# Patient Record
Sex: Male | Born: 2000 | Race: Black or African American | Hispanic: No | Marital: Single | Smoking: Never smoker
Health system: Southern US, Community
[De-identification: ages and names within clinical notes are randomized; demographics above are authoritative.]

---
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
## 2018-09-05 ENCOUNTER — Other Ambulatory Visit: Payer: Self-pay

## 2018-09-05 ENCOUNTER — Emergency Department (HOSPITAL_BASED_OUTPATIENT_CLINIC_OR_DEPARTMENT_OTHER)
Admission: EM | Admit: 2018-09-05 | Discharge: 2018-09-05 | Disposition: A | Discharge status: Home / Self Care | Payer: Medicaid Other | Attending: Emergency Medicine | Admitting: Emergency Medicine

## 2018-09-05 ENCOUNTER — Emergency Department (HOSPITAL_BASED_OUTPATIENT_CLINIC_OR_DEPARTMENT_OTHER): Payer: Medicaid Other

## 2018-09-05 ENCOUNTER — Encounter (HOSPITAL_BASED_OUTPATIENT_CLINIC_OR_DEPARTMENT_OTHER): Payer: Self-pay | Admitting: *Deleted

## 2018-09-05 DIAGNOSIS — F17210 Nicotine dependence, cigarettes, uncomplicated: Secondary | ICD-10-CM | POA: Insufficient documentation

## 2018-09-05 DIAGNOSIS — R0789 Other chest pain: Secondary | ICD-10-CM | POA: Insufficient documentation

## 2018-09-05 DIAGNOSIS — Z79899 Other long term (current) drug therapy: Secondary | ICD-10-CM | POA: Insufficient documentation

## 2018-09-05 NOTE — ED Triage Notes (Signed)
Pt reports recurrent chest pain x 2-3 weeks

## 2018-09-07 NOTE — ED Provider Notes (Signed)
[facility] EMERGENCY DEPARTMENT Provider Note   CSN: [id number] Arrival date & time: 09/05/18  1736     History   Chief Complaint Chief Complaint  Patient presents with  . Chest Pain    HPI Wesley Arias is a 17 y.o. male.  17yo M who p/w chest pain.  Patient states that he has had intermittent episodes of sharp, central, nonradiating chest pain over the past 2 to 3 weeks.  Pain is not related to exertion.  Nothing seems to bring it on.  Nothing makes it better.  He occasionally has some shortness of breath with it.  No vomiting, fevers, or significant cough/URI symptoms.  No major change in physical activity.  No recent travel, history of blood clots, or leg swelling. Denies drug use.  The history is provided by the patient.  Chest Pain      History reviewed. No pertinent past medical history.  There are no active problems to display for this patient.   No past surgical history on file.      Home Medications    Prior to Admission medications   Medication Sig Start Date End Date Taking? Authorizing Provider  omeprazole (PRILOSEC) 20 MG capsule Take 1 capsule (20 mg total) by mouth daily. 12/17/17   Molpus, Jonny RuizJohn, MD  ondansetron (ZOFRAN ODT) 8 MG disintegrating tablet Take 1 tablet (8 mg total) by mouth every 8 (eight) hours as needed for nausea or vomiting. 12/17/17   Molpus, Jonny RuizJohn, MD    Family History No family history on file.  Social History Social History   Tobacco Use  . Smoking status: Current Every Day Smoker    Types: Cigarettes  . Smokeless tobacco: Never Used  Substance Use Topics  . Alcohol use: No    Frequency: Never  . Drug use: Yes     Allergies   Patient has no known allergies.   Review of Systems Review of Systems  Cardiovascular: Positive for chest pain.   All other systems reviewed and are negative except that which was mentioned in HPI   Physical Exam Updated Vital Signs BP 110/75   Pulse 75   Temp 98.3 F (36.8  C)   Resp 18   Ht 6\' 4"  (1.93 m)   Wt 114.8 kg   SpO2 100%   BMI 30.80 kg/m   Physical Exam  Constitutional: He is oriented to person, place, and time. He appears well-developed and well-nourished. No distress.  HENT:  Head: Normocephalic and atraumatic.  Moist mucous membranes  Eyes: Conjunctivae are normal.  Neck: Neck supple.  Cardiovascular: Normal rate, regular rhythm and normal heart sounds. Exam reveals no friction rub.  No murmur heard.  No systolic murmur is present. Pulmonary/Chest: Effort normal and breath sounds normal.  Abdominal: Soft. Bowel sounds are normal. He exhibits no distension. There is no tenderness.  Musculoskeletal: He exhibits no edema.  Neurological: He is alert and oriented to person, place, and time.  Fluent speech  Skin: Skin is warm and dry.  Psychiatric: He has a normal mood and affect. Judgment normal.  Nursing note and vitals reviewed.    ED Treatments / Results  Labs (all labs ordered are listed, but only abnormal results are displayed) Labs Reviewed - No data to display  EKG EKG Interpretation  Date/Time:  Wednesday September 05 2018 17:42:29 EST Ventricular Rate:  76 PR Interval:  176 QRS Duration: 84 QT Interval:  334 QTC Calculation: 375 R Axis:   80 Text Interpretation:  Normal  sinus rhythm Normal ECG No previous ECGs available Confirmed by Frederick Peers [phone]) on 09/05/2018 5:52:24 PM Also confirmed by Frederick Peers [phone]), editor Elita Quick [phone])  on 09/06/2018 10:13:03 AM   Radiology Dg Chest 2 View  Result Date: 09/05/2018 CLINICAL DATA:  Intermittent chest pain EXAM: CHEST - 2 VIEW COMPARISON:  None. FINDINGS: The heart size and mediastinal contours are within normal limits. Both lungs are clear. The visualized skeletal structures are unremarkable. IMPRESSION: No active cardiopulmonary disease. Electronically Signed   By: Jasmine Pang M.D.   On: 09/05/2018 18:33    Procedures Procedures (including  critical care time)  Medications Ordered in ED Medications - No data to display   Initial Impression / Assessment and Plan / ED Course  I have reviewed the triage vital signs and the nursing notes.  Pertinent imaging results that were available during my care of the patient were reviewed by me and considered in my medical decision making (see chart for details).    Well appearing, normal VS. Clear breath sounds. CXR negative which is reassuring against PTX or pneumonia. EKG unremarkable. No sx suggestive of pericarditis or myocarditis. Given age and health, I do not feel he needs lab work currently. Discussed supportive measures including NSAIDS and reviewed return precautions.  Final Clinical Impressions(s) / ED Diagnoses   Final diagnoses:  Atypical chest pain    ED Discharge Orders    None       Kamden Reber, Ambrose Finland, MD 09/07/18 [phone]

## 2018-10-11 ENCOUNTER — Other Ambulatory Visit: Payer: Self-pay

## 2018-10-11 ENCOUNTER — Emergency Department (HOSPITAL_BASED_OUTPATIENT_CLINIC_OR_DEPARTMENT_OTHER)
Admission: EM | Admit: 2018-10-11 | Discharge: 2018-10-11 | Disposition: A | Discharge status: Home / Self Care | Payer: Medicaid Other | Attending: Emergency Medicine | Admitting: Emergency Medicine

## 2018-10-11 ENCOUNTER — Encounter (HOSPITAL_BASED_OUTPATIENT_CLINIC_OR_DEPARTMENT_OTHER): Payer: Self-pay

## 2018-10-11 DIAGNOSIS — R05 Cough: Secondary | ICD-10-CM | POA: Insufficient documentation

## 2018-10-11 DIAGNOSIS — Z5321 Procedure and treatment not carried out due to patient leaving prior to being seen by health care provider: Secondary | ICD-10-CM | POA: Insufficient documentation

## 2018-10-11 NOTE — ED Triage Notes (Signed)
C/o flu like sx x 5 days-NAD-steady gait 

## 2018-10-28 ENCOUNTER — Encounter (HOSPITAL_COMMUNITY): Payer: Self-pay | Admitting: Emergency Medicine

## 2018-10-28 ENCOUNTER — Emergency Department (HOSPITAL_COMMUNITY)
Admission: EM | Admit: 2018-10-28 | Discharge: 2018-10-28 | Disposition: A | Discharge status: Home / Self Care | Payer: Self-pay | Attending: Emergency Medicine | Admitting: Emergency Medicine

## 2018-10-28 ENCOUNTER — Other Ambulatory Visit: Payer: Self-pay

## 2018-10-28 ENCOUNTER — Emergency Department (HOSPITAL_COMMUNITY): Payer: Self-pay

## 2018-10-28 DIAGNOSIS — R0789 Other chest pain: Secondary | ICD-10-CM | POA: Insufficient documentation

## 2018-10-28 DIAGNOSIS — R05 Cough: Secondary | ICD-10-CM | POA: Insufficient documentation

## 2018-10-28 DIAGNOSIS — Z79899 Other long term (current) drug therapy: Secondary | ICD-10-CM | POA: Insufficient documentation

## 2018-10-28 DIAGNOSIS — R030 Elevated blood-pressure reading, without diagnosis of hypertension: Secondary | ICD-10-CM

## 2018-10-28 DIAGNOSIS — R0602 Shortness of breath: Secondary | ICD-10-CM | POA: Insufficient documentation

## 2018-10-28 DIAGNOSIS — R6883 Chills (without fever): Secondary | ICD-10-CM | POA: Insufficient documentation

## 2018-10-28 DIAGNOSIS — R079 Chest pain, unspecified: Secondary | ICD-10-CM

## 2018-10-28 DIAGNOSIS — R059 Cough, unspecified: Secondary | ICD-10-CM

## 2018-10-28 NOTE — ED Triage Notes (Addendum)
Pt c/o fever, cough and flu like symptoms x 2 weeks. Pt has been taking OTC meds with no relief. Pt states he thinks he has been around others that are sick.

## 2018-10-28 NOTE — Discharge Instructions (Addendum)
Please read and follow all provided instructions.  Your diagnoses today include:  1. Cough   2. Central chest pain     You appear to have an upper respiratory infection (URI). An upper respiratory tract infection, or cold, is a viral infection of the air passages leading to the lungs. It should improve gradually after 5-7 days. You may have a lingering cough that lasts for 2- 4 weeks after the infection.  Tests performed today include: Vital signs. See below for your results today.   Medications prescribed:   Take any prescribed medications only as directed. Treatment for your infection is aimed at treating the symptoms. There are no medications, such as antibiotics, that will cure your infection.   I recommend Mucinex DM.  This is a medication that has a cough suppressant and a medication to loosen the mucus in your lungs.  It is taken twice a day for 5 to 7 days.  Home care instructions:  Follow any educational materials contained in this packet.   Please continue drinking plenty of fluids.  Use over-the-counter medicines as needed as directed on packaging for symptom relief.  You may also use ibuprofen or tylenol as directed on packaging for pain or fever.  Do not take multiple medicines containing Tylenol or acetaminophen to avoid taking too much of this medication.  Follow-up instructions: Please follow-up with your primary care provider in the next 3 days for further evaluation of your symptoms if you are not feeling better.   Return instructions:  Please return to the Emergency Department if you experience worsening symptoms.  RETURN IMMEDIATELY IF you develop shortness of breath, chest pain, confusion or altered mental status, a new rash, become dizzy, faint, or poorly responsive, or are unable to be cared for at home. Please return if you have persistent vomiting and cannot keep down fluids or develop a fever that is not controlled by tylenol or motrin.   Please return if you  have any other emergent concerns.  Additional Information: You will need blood pressure recheck as soon as you establish primary care.   Your vital signs today were: BP (!) 142/86    Pulse 62    Temp 98.9 F (37.2 C) (Oral)    Resp 18    SpO2 99%  If your blood pressure (BP) was elevated above 135/85 this visit, please have this repeated by your doctor within one month. --------------

## 2018-10-28 NOTE — ED Provider Notes (Addendum)
[facility]-EMERGENCY DEPT Provider Note   CSN: [id number] Arrival date & time: 10/28/18  1802     History   Chief Complaint Chief Complaint  Patient presents with  . Fever  . Cough    HPI Wesley Arias is a 18 y.o. male.  HPI   Patient is a 18 year old male with no significant past medical history presenting for cough, chest discomfort and sputum production for 2 weeks.  Patient reports that his pain today began when he was in a "stressful situation".  He reports he was a slight short of breath at this time.  Patient reports that he began having a cough a couple weeks ago and thought he had fevers and night sweats at this time but nothing was recorded.  He reports he had one episode for the last 2 weeks of scant hemoptysis.  He reports white sputum production.  Patient denies that chest pain is exertional.  He is also denying any sore throat, congestion or rhinorrhea.  Patient denies any history of DVT/PE, recent immobilization, hospitalization, hormone use, cancer treatment, recent surgery, lower extremity edema or calf tenderness.  History reviewed. No pertinent past medical history.  There are no active problems to display for this patient.   History reviewed. No pertinent surgical history.      Home Medications    Prior to Admission medications   Medication Sig Start Date End Date Taking? Authorizing Provider  omeprazole (PRILOSEC) 20 MG capsule Take 1 capsule (20 mg total) by mouth daily. 12/17/17   Molpus, Jonny RuizJohn, MD  ondansetron (ZOFRAN ODT) 8 MG disintegrating tablet Take 1 tablet (8 mg total) by mouth every 8 (eight) hours as needed for nausea or vomiting. 12/17/17   Molpus, Jonny RuizJohn, MD    Family History No family history on file.  Social History Social History   Tobacco Use  . Smoking status: Never Smoker  . Smokeless tobacco: Never Used  Substance Use Topics  . Alcohol use: No    Frequency: Never  . Drug use: Yes    Types: Marijuana      Allergies   Patient has no known allergies.   Review of Systems Review of Systems  Constitutional: Positive for chills. Negative for fever.  HENT: Negative for congestion and rhinorrhea.   Respiratory: Positive for cough. Negative for shortness of breath, wheezing and stridor.   Cardiovascular: Positive for chest pain. Negative for palpitations and leg swelling.  Gastrointestinal: Negative for abdominal pain, nausea and vomiting.     Physical Exam Updated Vital Signs BP (!) 142/86   Pulse 62   Temp 98.9 F (37.2 C) (Oral)   Resp 18   SpO2 99%   Physical Exam Vitals signs and nursing note reviewed.  Constitutional:      General: He is not in acute distress.    Appearance: He is well-developed.  HENT:     Head: Normocephalic and atraumatic.     Right Ear: Tympanic membrane normal.     Left Ear: Tympanic membrane normal.  Eyes:     Conjunctiva/sclera: Conjunctivae normal.     Pupils: Pupils are equal, round, and reactive to light.  Neck:     Musculoskeletal: Normal range of motion and neck supple.  Cardiovascular:     Rate and Rhythm: Normal rate and regular rhythm.     Pulses:          Radial pulses are 2+ on the right side and 2+ on the left side.     Heart  sounds: S1 normal and S2 normal. No murmur.     Comments: No lower extremity edema or calf tenderness. Pulmonary:     Effort: Pulmonary effort is normal.     Breath sounds: Normal breath sounds. No wheezing or rales.  Abdominal:     General: There is no distension.     Palpations: Abdomen is soft.     Tenderness: There is no abdominal tenderness. There is no guarding.  Musculoskeletal: Normal range of motion.        General: No deformity.  Lymphadenopathy:     Cervical: No cervical adenopathy.  Skin:    General: Skin is warm and dry.     Findings: No erythema or rash.  Neurological:     Mental Status: He is alert.     Comments: Cranial nerves grossly intact. Patient moves extremities symmetrically  and with good coordination.  Psychiatric:        Behavior: Behavior normal.        Thought Content: Thought content normal.        Judgment: Judgment normal.      ED Treatments / Results  Labs (all labs ordered are listed, but only abnormal results are displayed) Labs Reviewed - No data to display  EKG EKG Interpretation  Date/Time:  Sunday October 28 2018 21:51:43 EST Ventricular Rate:  67 PR Interval:    QRS Duration: 95 QT Interval:  379 QTC Calculation: 400 R Axis:   58 Text Interpretation:  Sinus rhythm Elevated J point  No STEMI Confirmed by Alona Bene [phone]) on 10/28/2018 10:27:48 PM   Radiology Dg Chest 2 View  Result Date: 10/28/2018 CLINICAL DATA:  Cough EXAM: CHEST - 2 VIEW COMPARISON:  09/05/2018 FINDINGS: The heart size and mediastinal contours are within normal limits. Both lungs are clear. The visualized skeletal structures are unremarkable. IMPRESSION: No active cardiopulmonary disease. Electronically Signed   By: Jasmine Pang M.D.   On: 10/28/2018 22:21    Procedures Procedures (including critical care time)  Medications Ordered in ED Medications - No data to display   Initial Impression / Assessment and Plan / ED Course  I have reviewed the triage vital signs and the nursing notes.  Pertinent labs & imaging results that were available during my care of the patient were reviewed by me and considered in my medical decision making (see chart for details).     Patient nontoxic-appearing, afebrile, hemodynamically stable without tachycardia, tachypnea or hypoxia.  Differential diagnosis includes viral URI with cough, bronchitis, chest wall pain, pericarditis, pulmonary embolism.  Doubtful pulmonary embolism, as patient has stable and normal vital signs with no tachycardia, tachypnea or hypoxia.  He had one episode greater over the past 2 weeks of hemoptysis.  He reports that his chest pain is primarily with raising his arms over the head, and when he  is in a high emotional state.  He otherwise has no risk factors for pulmonary embolism and is Wells and PERC negative.  EKG in normal sinus rhythm without evidence of ischemia, infarction, or arrhythmia consistent with prior.  EKG not consistent with pericarditis.  Chest x-ray demonstrates no evidence of cardiopulmonary disease, reviewed by me.  Doubt myocarditis, his chest pain is intermittent, and appears positional in nature. No signs of fluid overload.   Patient well-appearing with no complaints at time of discharge.  Recommended OTC expectorants and cough suppressants.  Feel the patient is stable for discharge all questions have been answered.  Return precautions were given for any shortness  of breath, persistent chest pain, fevers, or hemoptysis.  Patient is in understanding and agrees with the plan of care.  Final Clinical Impressions(s) / ED Diagnoses   Final diagnoses:  Cough  Central chest pain    ED Discharge Orders    None       Delia Chimes 10/28/18 2234    Delia Chimes 10/28/18 2237    Azalia Bilis, MD 10/28/18 2350

## 2019-11-24 IMAGING — CR DG CHEST 2V
2 series · 2 of 2 positions shown · non-contrast
Comparison: None.

CLINICAL DATA: Intermittent chest pain

EXAM:
CHEST - 2 VIEW

[w chest pa]
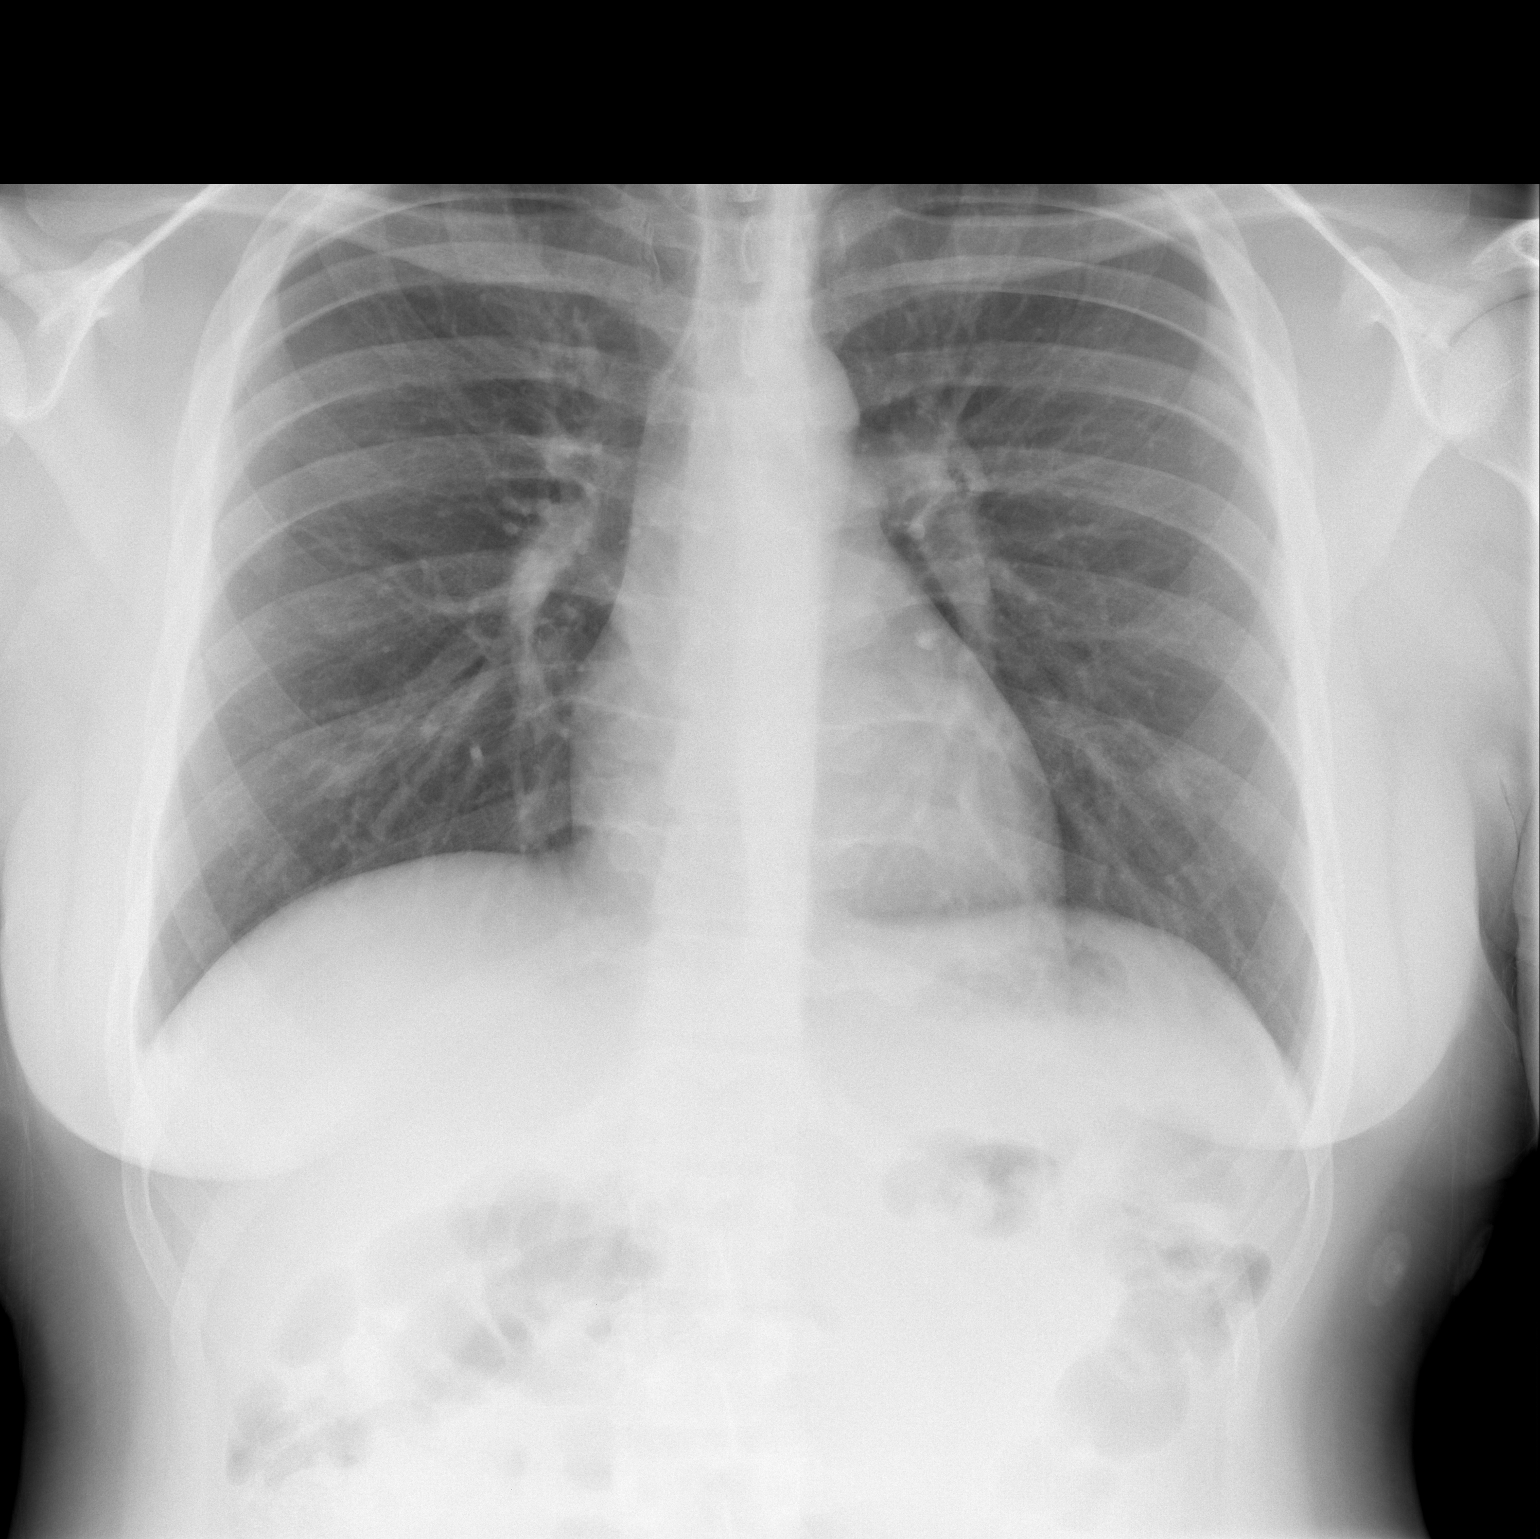

[w chest lat]
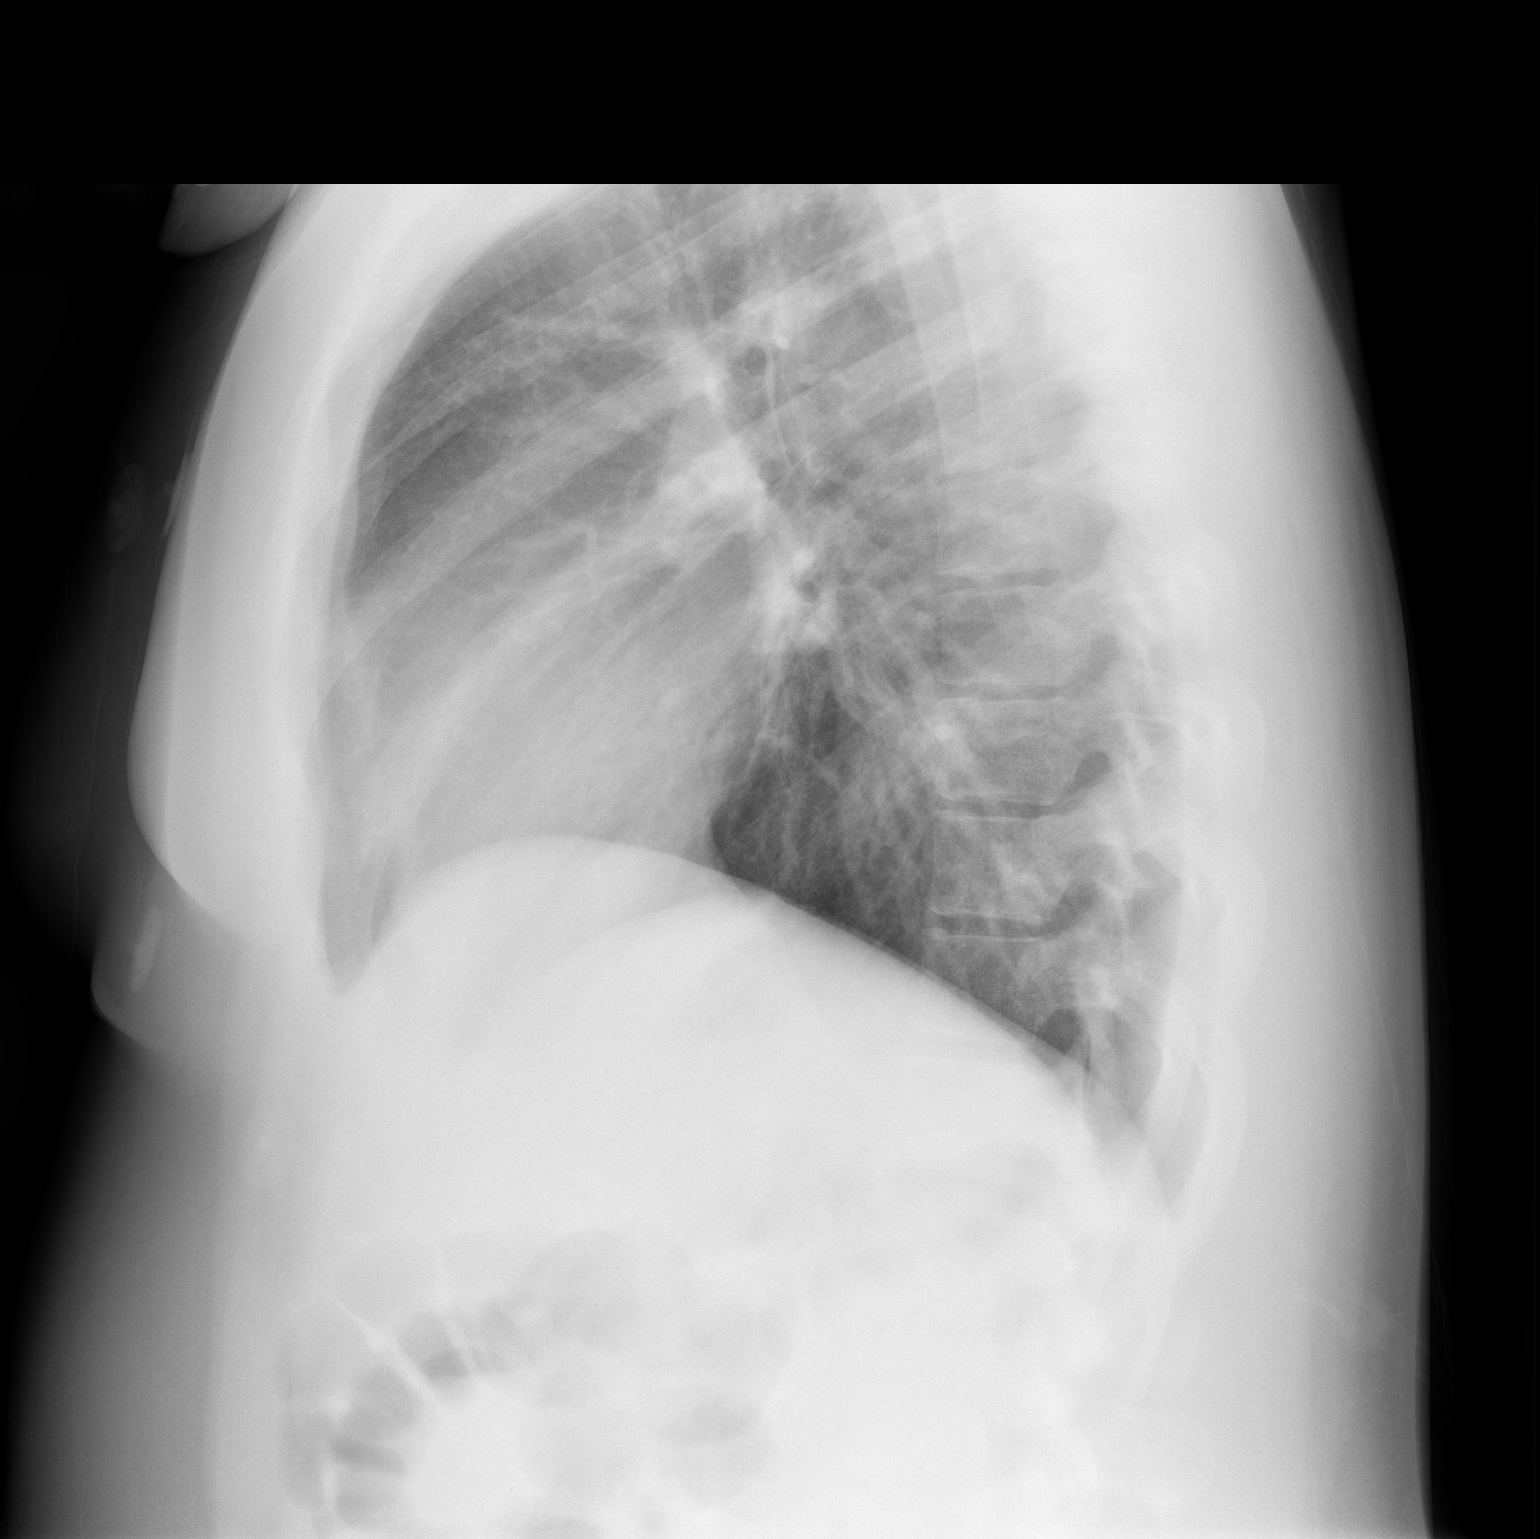

[2 of 2 positions shown; findings below may reference images not displayed]

FINDINGS: The heart size and mediastinal contours are within normal limits.
Both lungs are clear. The visualized skeletal structures are
unremarkable.
IMPRESSION: No active cardiopulmonary disease.

## 2020-01-16 IMAGING — CR DG CHEST 2V
2 series · 2 of 2 positions shown · non-contrast
Comparison: 09/05/2018

CLINICAL DATA: Cough

EXAM:
CHEST - 2 VIEW

[w chest pa]
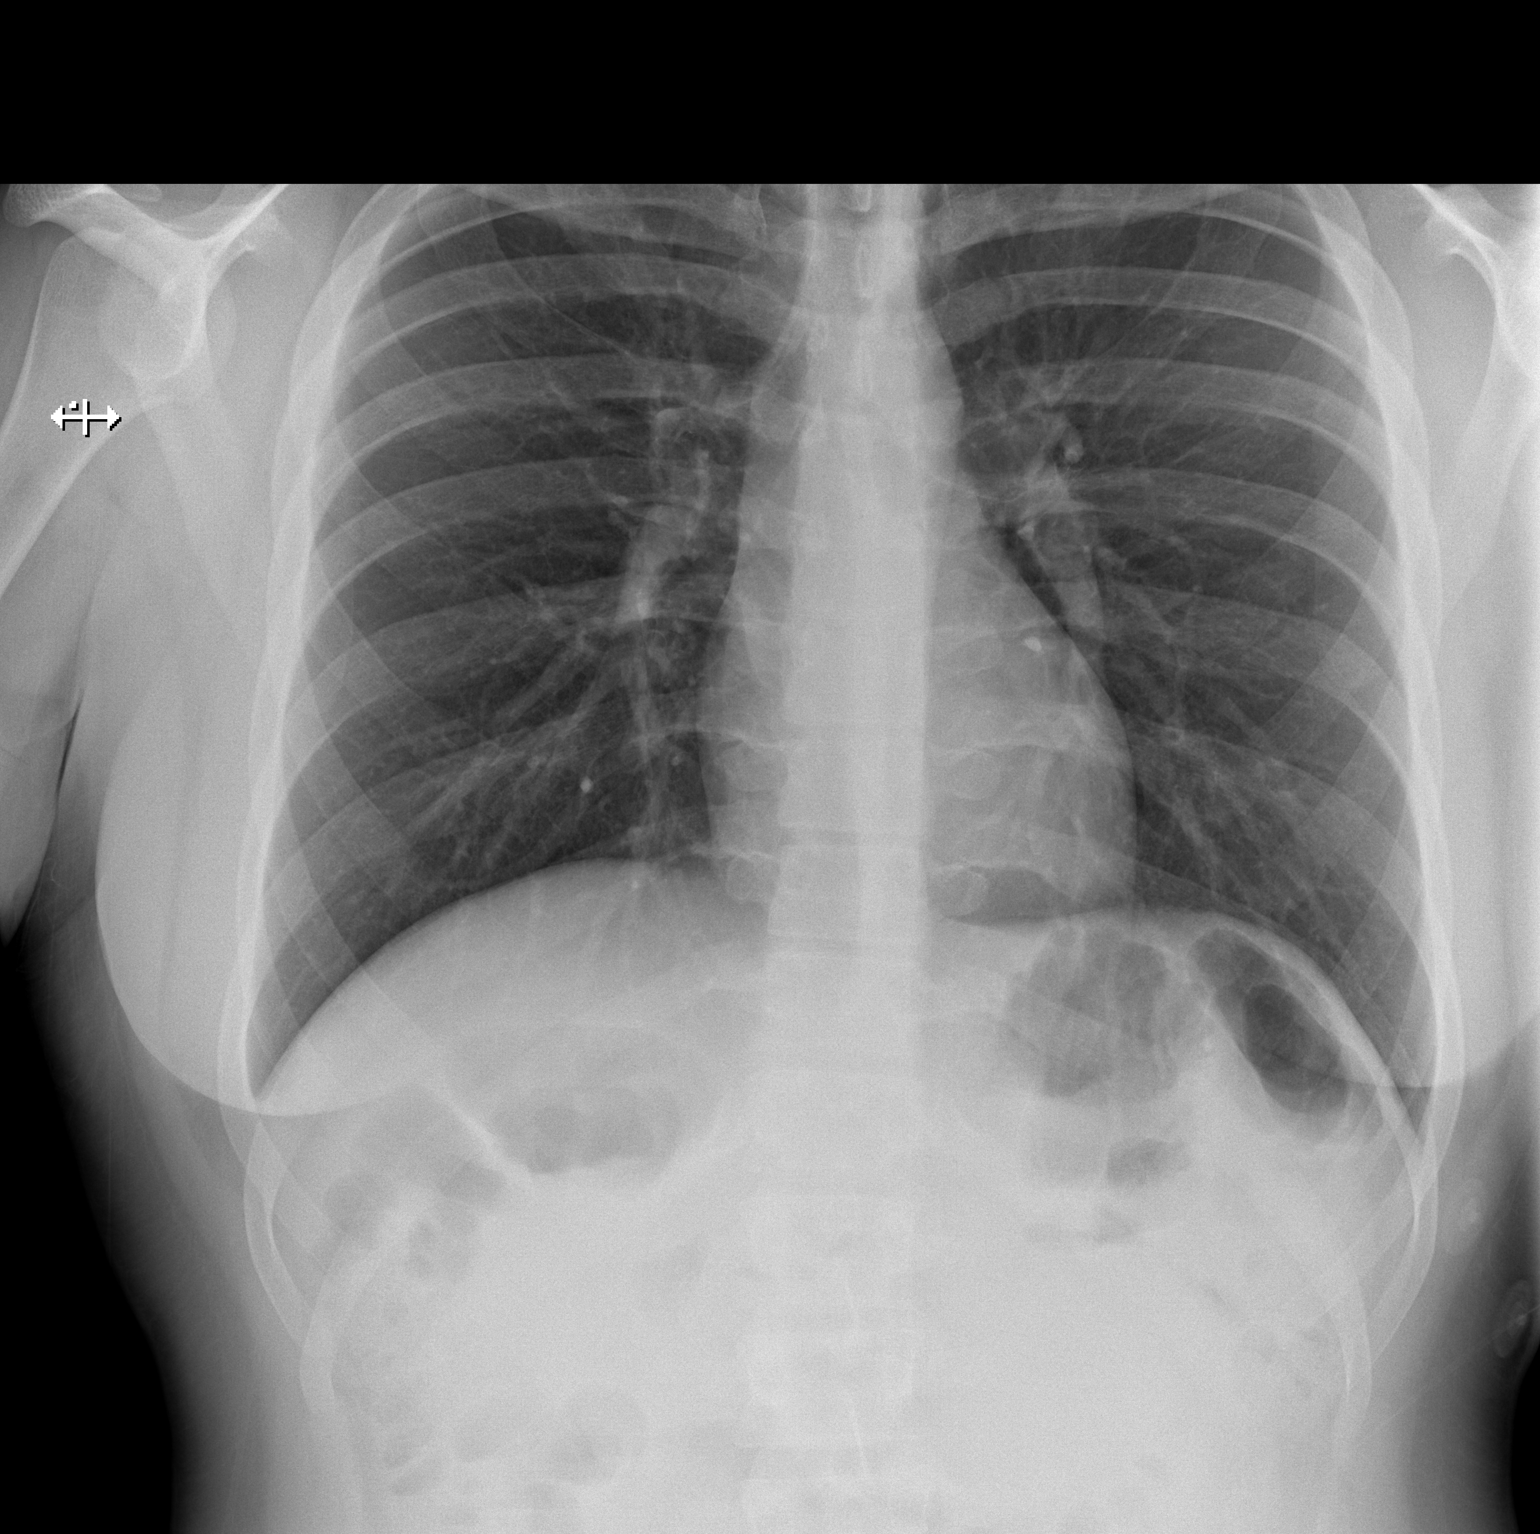

[w chest lat]
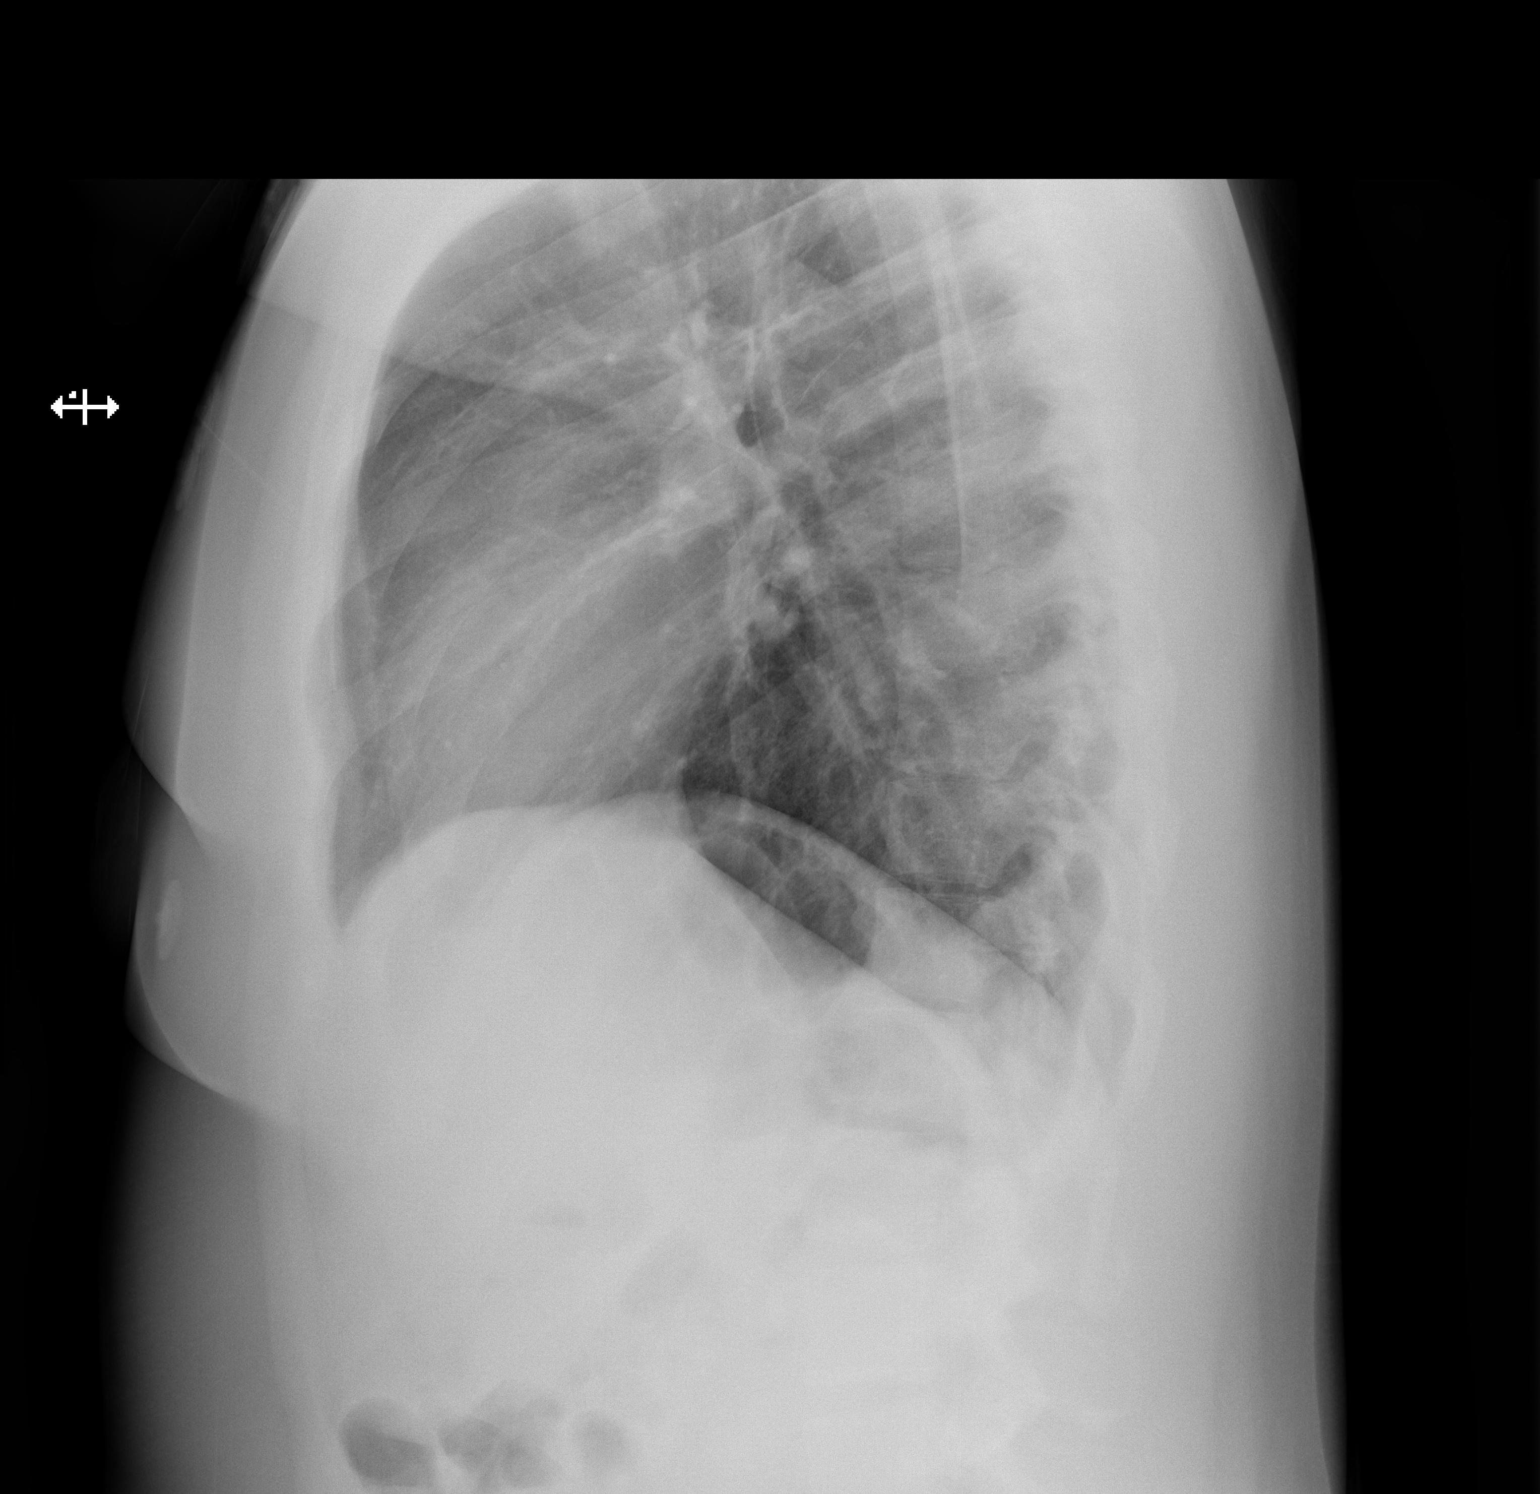

[2 of 2 positions shown; findings below may reference images not displayed]

FINDINGS: The heart size and mediastinal contours are within normal limits.
Both lungs are clear. The visualized skeletal structures are
unremarkable.
IMPRESSION: No active cardiopulmonary disease.

## 2023-02-09 ENCOUNTER — Other Ambulatory Visit: Payer: Self-pay

## 2023-02-09 ENCOUNTER — Emergency Department (HOSPITAL_BASED_OUTPATIENT_CLINIC_OR_DEPARTMENT_OTHER): Payer: Commercial Managed Care - HMO

## 2023-02-09 ENCOUNTER — Emergency Department (HOSPITAL_BASED_OUTPATIENT_CLINIC_OR_DEPARTMENT_OTHER)
Admission: EM | Admit: 2023-02-09 | Discharge: 2023-02-09 | Disposition: A | Discharge status: Home / Self Care | Payer: Commercial Managed Care - HMO | Attending: Emergency Medicine | Admitting: Emergency Medicine

## 2023-02-09 ENCOUNTER — Encounter (HOSPITAL_BASED_OUTPATIENT_CLINIC_OR_DEPARTMENT_OTHER): Payer: Self-pay | Admitting: Emergency Medicine

## 2023-02-09 DIAGNOSIS — S60351A Superficial foreign body of right thumb, initial encounter: Secondary | ICD-10-CM | POA: Insufficient documentation

## 2023-02-09 DIAGNOSIS — Y9289 Other specified places as the place of occurrence of the external cause: Secondary | ICD-10-CM | POA: Insufficient documentation

## 2023-02-09 DIAGNOSIS — Y9389 Activity, other specified: Secondary | ICD-10-CM | POA: Insufficient documentation

## 2023-02-09 DIAGNOSIS — Z23 Encounter for immunization: Secondary | ICD-10-CM | POA: Insufficient documentation

## 2023-02-09 DIAGNOSIS — W458XXA Other foreign body or object entering through skin, initial encounter: Secondary | ICD-10-CM | POA: Insufficient documentation

## 2023-02-09 DIAGNOSIS — Y990 Civilian activity done for income or pay: Secondary | ICD-10-CM | POA: Diagnosis not present

## 2023-02-09 DIAGNOSIS — S6991XA Unspecified injury of right wrist, hand and finger(s), initial encounter: Secondary | ICD-10-CM | POA: Diagnosis present

## 2023-02-09 DIAGNOSIS — T23261A Burn of second degree of back of right hand, initial encounter: Secondary | ICD-10-CM | POA: Diagnosis not present

## 2023-02-09 DIAGNOSIS — X19XXXA Contact with other heat and hot substances, initial encounter: Secondary | ICD-10-CM | POA: Insufficient documentation

## 2023-02-09 MED ORDER — TETANUS-DIPHTH-ACELL PERTUSSIS 5-2.5-18.5 LF-MCG/0.5 IM SUSY
0.5000 mL | PREFILLED_SYRINGE | Freq: Once | INTRAMUSCULAR | Status: AC
  Administered 2023-02-09: 0.5 mL via INTRAMUSCULAR
  Filled 2023-02-09: qty 0.5

## 2023-02-09 MED ORDER — BACITRACIN ZINC 500 UNIT/GM EX OINT: 1.0000 | TOPICAL_OINTMENT | Freq: Two times a day (BID) | CUTANEOUS | 0 refills | Status: AC

## 2023-02-09 MED ORDER — LIDOCAINE HCL (PF) 1 % IJ SOLN
5.0000 mL | Freq: Once | INTRAMUSCULAR | Status: AC
  Administered 2023-02-09: 5 mL
  Filled 2023-02-09: qty 5

## 2023-02-09 NOTE — ED Notes (Signed)
Discharge instructions reviewed with patient. Patient verbalizes understanding, no further questions at this time. Medications/prescriptions and follow up information provided. No acute distress noted at time of departure.  
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
## 2023-02-09 NOTE — ED Provider Notes (Signed)
[place] [facility] Provider Note   CSN: [id number] Arrival date & time: 02/09/23  1607     History  Chief Complaint  Patient presents with   Finger Injury    Right thumb    Wesley Arias is a 22 y.o. male reportedly otherwise healthy presents the emergency room today for evaluation of metal stuck in his right thumb.  Patient reports that he was at work and was pulling out a truck bed when he felt the metal pierced his skin.  Additionally, patient reports that he burned the back of his right hand a few days ago on a car.  Unsure when his last tetanus was.  Denies any numbness or tingling to the area.  Denies any other injury.  No known drug allergies.  HPI     Home Medications Prior to Admission medications   Medication Sig Start Date End Date Taking? Authorizing Provider  omeprazole (PRILOSEC) 20 MG capsule Take 1 capsule (20 mg total) by mouth daily. 12/17/17   Molpus, Jonny Ruiz, MD  ondansetron (ZOFRAN ODT) 8 MG disintegrating tablet Take 1 tablet (8 mg total) by mouth every 8 (eight) hours as needed for nausea or vomiting. 12/17/17   Molpus, Jonny Ruiz, MD      Allergies    Patient has no known allergies.    Review of Systems   Review of Systems  Skin:  Positive for wound.    Physical Exam Updated Vital Signs BP 132/85 (BP Location: Left Arm)   Pulse 86   Temp 98.8 F (37.1 C) (Oral)   Resp 16   Wt 102.1 kg   SpO2 96%   BMI 28.12 kg/m  Physical Exam Vitals and nursing note reviewed.  Constitutional:      Appearance: Normal appearance.  Eyes:     General: No scleral icterus. Pulmonary:     Effort: Pulmonary effort is normal. No respiratory distress.  Musculoskeletal:     Comments: Small foreign body noted in the volar aspect of the distal tip of the right thumb.  Cap refill brisk.  No nail involvement.  Radial pulses intact.  Flexion extension still present in the thumb.  Good finger thumb opposition with and without resistance.   Skin:    General: Skin is dry.     Comments: Intact blister seen to the MCP of the index finger on the dorsal aspect approximately the size of a peanut. Compartments soft. Cap refill brisk.   Neurological:     General: No focal deficit present.     Mental Status: He is alert. Mental status is at baseline.  Psychiatric:        Mood and Affect: Mood normal.     ED Results / Procedures / Treatments   Labs (all labs ordered are listed, but only abnormal results are displayed) Labs Reviewed - No data to display  EKG None  Radiology DG Finger Thumb Right  Addendum Date: 02/09/2023   ADDENDUM REPORT: 02/09/2023 17:29 ADDENDUM: Images were reviewed with additional clinical history. In the oblique view, there is a thin linear density measuring 3 mm in subcutaneous plane in the palmar aspect of the tip of right thumb suggesting faintly opaque foreign body in the soft tissues. Imaging finding was discussed with patient's provider Achille Rich by telephone call. Electronically Signed   By: Ernie Avena M.D.   On: 02/09/2023 17:29   Result Date: 02/09/2023 CLINICAL DATA:  Trauma, evaluate for foreign body EXAM: RIGHT THUMB 2+V COMPARISON:  None Available. FINDINGS: No fracture or dislocation is seen. There are no radiopaque foreign bodies. Small smooth marginated calcification in the palmar aspect of interphalangeal joint may be residual from previous injury or suggest degenerative arthritis. Minimal bony spurs are seen in first metacarpophalangeal joint. IMPRESSION: No recent fracture or dislocation is seen. There are no radiopaque foreign bodies. Electronically Signed: By: Ernie Avena M.D. On: 02/09/2023 17:16    Procedures .Foreign Body Removal  Date/Time: 02/09/2023 5:51 PM  Performed by: Achille Rich, PA-C Authorized by: Achille Rich, PA-C  Consent: Verbal consent obtained. Risks and benefits: risks, benefits and alternatives were discussed Consent given by: patient Patient  understanding: patient states understanding of the procedure being performed Patient identity confirmed: verbally with patient Body area: skin General location: upper extremity Location details: right thumb Anesthesia: local infiltration  Anesthesia: Local Anesthetic: lidocaine 1% without epinephrine Anesthetic total: 1 mL Complexity: simple 1 objects recovered. Objects recovered: small, thin metalic fragment. Approx 3-4 cm. Post-procedure assessment: foreign body removed Comments: Risk and benefits discussed including infection, pain, retained foreign body.  Obtained verbal consent.  Area was cleansed with chlorhexidine and then numbed with lidocaine 1% without epi.  Took an 11 blade and made a very small, very superficial incision on the metallic foreign body and removed it with a pair of hemostats.  Foreign body appears to be removed.     Medications Ordered in ED Medications  lidocaine (PF) (XYLOCAINE) 1 % injection 5 mL (has no administration in time range)  Tdap (BOOSTRIX) injection 0.5 mL (0.5 mLs Intramuscular Given 02/09/23 1707)    ED Course/ Medical Decision Making/ A&P   }                          Medical Decision Making Amount and/or Complexity of Data Reviewed Radiology: ordered.  Risk OTC drugs. Prescription drug management.   22 y.o. male presents to the ER today for evaluation of foreign body in right thumb. Differential diagnosis includes but is not limited to foreign body versus broken foreign body versus DCS infection. Vital signs are unremarkable. Physical exam as noted above.   X-ray imaging shows there is a thin linear density measuring 3 mm in subcutaneous plane in the palmar aspect of the tip of right thumb suggesting faintly opaque foreign body in the soft tissues.  Please see procedure note for additional information.  Foreign body successfully removed.  Seems to be very superficial and just under the callus.  Minimal bleeding noted.  Area does not  appear contaminated.  I do not think this needs antibiotics at this time.  For his burn, it is from a few days prior.  It is small, and the blister is still intact.  Recommended that he clean both areas daily with Dial soap and water.  Recommended using the bacitracin ointment and keeping the areas covered.  Asked that he do not pick at the blister and to keep it intact for as long as possible.  We discussed following up with primary care doctor for reevaluation.  We discussed plan at bedside. We discussed strict return precautions and red flag symptoms. The patient verbalized their understanding and agrees to the plan. The patient is stable and being discharged home in good condition.  Portions of this report may have been transcribed using voice recognition software. Every effort was made to ensure accuracy; however, inadvertent computerized transcription errors may be present.   Final Clinical Impression(s) / ED Diagnoses Final  diagnoses:  Partial thickness burn of back of right hand, initial encounter  Foreign body of right thumb, initial encounter    Rx / DC Orders ED Discharge Orders          Ordered    bacitracin ointment  2 times daily        02/09/23 1746              Achille Rich, [place] 02/09/23 1755    Maia Plan, MD 02/09/23 [phone]

## 2023-02-09 NOTE — ED Triage Notes (Signed)
Piece of metal stuck to right thumb , was working on a car he said . Looks like splinter
